# Patient Record
Sex: Male | Born: 2000 | Race: Black or African American | Hispanic: No | Marital: Single | Smoking: Never smoker
Health system: Southern US, Community
[De-identification: ages and names within clinical notes are randomized; demographics above are authoritative.]

---
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
## 2020-07-25 ENCOUNTER — Ambulatory Visit
Admission: EM | Admit: 2020-07-25 | Discharge: 2020-07-25 | Disposition: A | Discharge status: Home / Self Care | Payer: No Typology Code available for payment source | Attending: Family Medicine | Admitting: Family Medicine

## 2020-07-25 ENCOUNTER — Other Ambulatory Visit: Payer: Self-pay

## 2020-07-25 DIAGNOSIS — J029 Acute pharyngitis, unspecified: Secondary | ICD-10-CM | POA: Insufficient documentation

## 2020-07-25 DIAGNOSIS — B349 Viral infection, unspecified: Secondary | ICD-10-CM | POA: Diagnosis not present

## 2020-07-25 DIAGNOSIS — Z20822 Contact with and (suspected) exposure to covid-19: Secondary | ICD-10-CM | POA: Insufficient documentation

## 2020-07-25 LAB — RESP PANEL BY RT-PCR (FLU A&B, COVID) ARPGX2
Influenza A by PCR: NEGATIVE
Influenza B by PCR: NEGATIVE
SARS Coronavirus 2 by RT PCR: NEGATIVE

## 2020-07-25 LAB — GROUP A STREP BY PCR: Group A Strep by PCR: NOT DETECTED

## 2020-07-25 NOTE — ED Provider Notes (Signed)
[facility]    CSN: [id number] Arrival date & time: 07/25/20  1316      History   Chief Complaint Chief Complaint  Patient presents with   Sore Throat    Sore Throat   20 year old male presents with the above complaint.  Symptoms started yesterday.  He reports sore throat and chills.  He denies cough to me.  He reported this to the nursing staff on triage.  He states that he is experiencing predominantly sore throat.  Denies fever.  Highest temperature was 99.5.  Denies sick contacts.  He rates his pain is 7/10 in severity.  Mother has given him Mucinex without relief.  No other medications or interventions tried.  No other complaints.  Home Medications    Prior to Admission medications   Not on File    Family History History reviewed. No pertinent family history.  Social History Social History   Tobacco Use   Smoking status: Never   Smokeless tobacco: Never  Vaping Use   Vaping Use: Never used  Substance Use Topics   Alcohol use: No   Drug use: Never     Allergies   Patient has no known allergies.   Review of Systems Review of Systems Per HPI  Physical Exam Triage Vital Signs ED Triage Vitals  Enc Vitals Group     BP 07/25/20 1334 120/81     Pulse Rate 07/25/20 1334 (!) 112     Resp 07/25/20 1334 17     Temp 07/25/20 1334 99 F (37.2 C)     Temp Source 07/25/20 1334 Oral     SpO2 07/25/20 1334 94 %     Weight 07/25/20 1332 172 lb (78 kg)     Height --      Head Circumference --      Peak Flow --      Pain Score 07/25/20 1332 7     Pain Loc --      Pain Edu? --      Excl. in GC? --    Updated Vital Signs BP 120/81 (BP Location: Right Arm)   Pulse (!) 112   Temp 99 F (37.2 C) (Oral)   Resp 17   Wt 78 kg   SpO2 94%   Visual Acuity Right Eye Distance:   Left Eye Distance:   Bilateral Distance:    Right Eye Near:   Left Eye Near:    Bilateral Near:     Physical Exam Vitals and nursing note reviewed.  Constitutional:       General: He is not in acute distress.    Appearance: Normal appearance. He is not ill-appearing.  HENT:     Head: Normocephalic and atraumatic.     Right Ear: Tympanic membrane normal.     Left Ear: Tympanic membrane normal.     Mouth/Throat:     Pharynx: Posterior oropharyngeal erythema present. No oropharyngeal exudate.  Cardiovascular:     Rate and Rhythm: Regular rhythm. Bradycardia present.  Pulmonary:     Effort: Pulmonary effort is normal.     Breath sounds: Normal breath sounds. No wheezing, rhonchi or rales.  Neurological:     Mental Status: He is alert.  Psychiatric:        Mood and Affect: Mood normal.        Behavior: Behavior normal.    UC Treatments / Results  Labs (all labs ordered are listed, but only abnormal results are displayed) Labs Reviewed  GROUP A  STREP BY PCR  RESP PANEL BY RT-PCR (FLU A&B, COVID) [id number]    EKG   Radiology No results found.  Procedures Procedures (including critical care time)  Medications Ordered in UC Medications - No data to display  Initial Impression / Assessment and Plan / UC Course  I have reviewed the triage vital signs and the nursing notes.  Pertinent labs & imaging results that were available during my care of the patient were reviewed by me and considered in my medical decision making (see chart for details).    20 year old male presents with a viral illness.  Strep, flu, COVID-negative.  Well-appearing on exam.  Advised supportive care and fluids, rest, and ibuprofen.  Final Clinical Impressions(s) / UC Diagnoses   Final diagnoses:  Viral illness     Discharge Instructions      All testing was negative.  Lots of fluids.  Rest.  Ibuprofen as needed.  Take care  Dr. Allene Pyo    ED Prescriptions   None    PDMP not reviewed this encounter.   Tommie Sams, [place] 07/25/20 1452

## 2020-07-25 NOTE — Discharge Instructions (Addendum)
All testing was negative.  Lots of fluids.  Rest.  Ibuprofen as needed.  Take care  Dr. Allene Pyo

## 2020-07-25 NOTE — ED Triage Notes (Signed)
Patient states that he has been having a sore throat, cough with congestion, chills and sweats. States that mom would like him checked for strep.
